# Patient Record
Sex: Male | Born: 1995 | Race: White | Hispanic: No | Marital: Single | Smoking: Never smoker
Health system: Southern US, Community
[De-identification: ages and names within clinical notes are randomized; demographics above are authoritative.]

---

## 2015-11-20 ENCOUNTER — Encounter: Payer: Self-pay | Admitting: Cardiology

## 2015-11-21 NOTE — Progress Notes (Signed)
This encounter was created in error - please disregard.

## 2015-12-02 ENCOUNTER — Other Ambulatory Visit: Payer: Self-pay | Admitting: Neurology

## 2015-12-02 DIAGNOSIS — G4482 Headache associated with sexual activity: Secondary | ICD-10-CM

## 2015-12-14 ENCOUNTER — Ambulatory Visit
Admission: RE | Admit: 2015-12-14 | Discharge: 2015-12-14 | Disposition: A | Discharge status: Home / Self Care | Payer: Self-pay | Source: Ambulatory Visit | Attending: Neurology | Admitting: Neurology

## 2015-12-14 ENCOUNTER — Ambulatory Visit: Payer: Self-pay

## 2015-12-14 DIAGNOSIS — G4482 Headache associated with sexual activity: Secondary | ICD-10-CM | POA: Insufficient documentation

## 2015-12-14 DIAGNOSIS — G43009 Migraine without aura, not intractable, without status migrainosus: Secondary | ICD-10-CM | POA: Insufficient documentation

## 2015-12-14 MED ORDER — GADOBENATE DIMEGLUMINE 529 MG/ML IV SOLN
20.0000 mL | Freq: Once | INTRAVENOUS | Status: AC | PRN
  Administered 2015-12-14: 20 mL via INTRAVENOUS

## 2016-02-18 ENCOUNTER — Ambulatory Visit (INDEPENDENT_AMBULATORY_CARE_PROVIDER_SITE_OTHER): Payer: Self-pay | Admitting: Family Medicine

## 2016-02-18 ENCOUNTER — Encounter: Payer: Self-pay | Admitting: Family Medicine

## 2016-02-18 VITALS — BP 136/96 | HR 65 | Temp 97.4°F | Resp 16

## 2016-02-18 DIAGNOSIS — J028 Acute pharyngitis due to other specified organisms: Secondary | ICD-10-CM

## 2016-02-18 DIAGNOSIS — R5383 Other fatigue: Secondary | ICD-10-CM

## 2016-02-18 LAB — POCT RAPID STREP A (OFFICE): Rapid Strep A Screen: NEGATIVE

## 2016-02-18 NOTE — Progress Notes (Signed)
Patient presents with symptoms of sore throat which started last week. Now patient has cough, nasal congestion, fatigue, chills at times. Denies CP, SOB, N/V/D, severe headache. Has not taken any medications today.  ROS: Negative except mentioned above.  Vitals as per Epic.  GENERAL: NAD HEENT: mild pharyngeal erythema, no exudate, no erythema of TMs, mild cervical LAD RESP: CTA B CARD: RRR ABD: +BS, NT, no organomegly appreciated  NEURO: CN II-XII grossly intact    A/P: URI, Pharyngitis - rapid strep test was negative, CBC w/diff and monospot test drawn. No athletic activity if febrile and until monospot result back. Delysm prn cough, Ibuprofen prn, rest, hydration, seek medical attention if symptoms persist or worsen.

## 2016-02-19 LAB — CBC WITH DIFFERENTIAL/PLATELET
BASOS ABS: 0 10*3/uL (ref 0.0–0.2)
Basos: 0 %
EOS (ABSOLUTE): 0.1 10*3/uL (ref 0.0–0.4)
Eos: 2 %
HEMOGLOBIN: 14.9 g/dL (ref 12.6–17.7)
Hematocrit: 43.3 % (ref 37.5–51.0)
IMMATURE GRANS (ABS): 0 10*3/uL (ref 0.0–0.1)
IMMATURE GRANULOCYTES: 0 %
LYMPHS: 25 %
Lymphocytes Absolute: 1.8 10*3/uL (ref 0.7–3.1)
MCH: 28.7 pg (ref 26.6–33.0)
MCHC: 34.4 g/dL (ref 31.5–35.7)
MCV: 83 fL (ref 79–97)
MONOCYTES: 10 %
Monocytes Absolute: 0.7 10*3/uL (ref 0.1–0.9)
NEUTROS ABS: 4.7 10*3/uL (ref 1.4–7.0)
NEUTROS PCT: 63 %
PLATELETS: 261 10*3/uL (ref 150–379)
RBC: 5.19 x10E6/uL (ref 4.14–5.80)
RDW: 13.1 % (ref 12.3–15.4)
WBC: 7.3 10*3/uL (ref 3.4–10.8)

## 2016-02-19 LAB — MONONUCLEOSIS SCREEN: MONO SCREEN: NEGATIVE

## 2016-11-19 ENCOUNTER — Other Ambulatory Visit: Payer: Self-pay | Admitting: Family Medicine

## 2016-11-19 DIAGNOSIS — M25562 Pain in left knee: Principal | ICD-10-CM

## 2016-11-19 DIAGNOSIS — G8929 Other chronic pain: Secondary | ICD-10-CM

## 2017-10-14 IMAGING — MR MR HEAD WO/W CM
13 series · 48 of 48 positions shown · IV contrast (multihance)
Comparison: None.

CLINICAL DATA: Exertional headache.

EXAM:
MRI HEAD WITHOUT AND WITH CONTRAST
MRA HEAD WITHOUT CONTRAST
TECHNIQUE: Multiplanar, multiecho pulse sequences of the brain and surrounding
structures were obtained without and with intravenous contrast. MR
venography images of the head were obtained using MRV technique
without contrast.
CONTRAST:  20mL MULTIHANCE GADOBENATE DIMEGLUMINE 529 MG/ML IV SOLN

[Series 2: T1 · sagittal · 5.0mm · 0.45mm/px · 3 of 27 slices shown (1 of 2)]
[im 1/27]
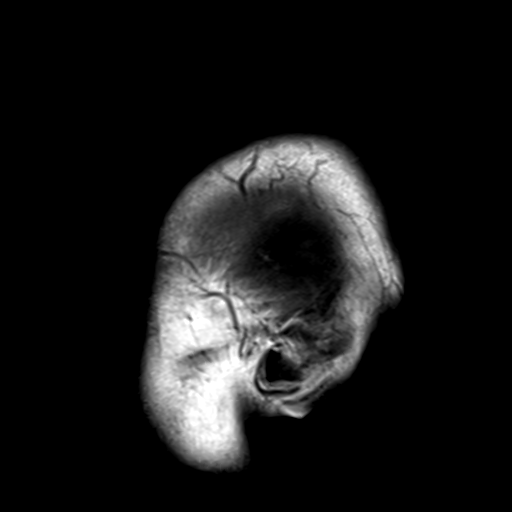
[im 14/27]
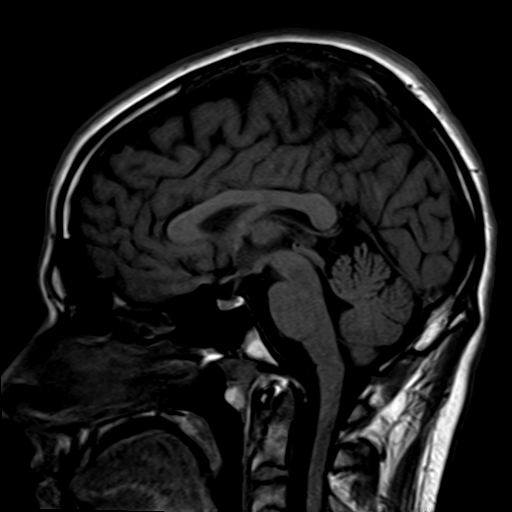
[im 27/27]
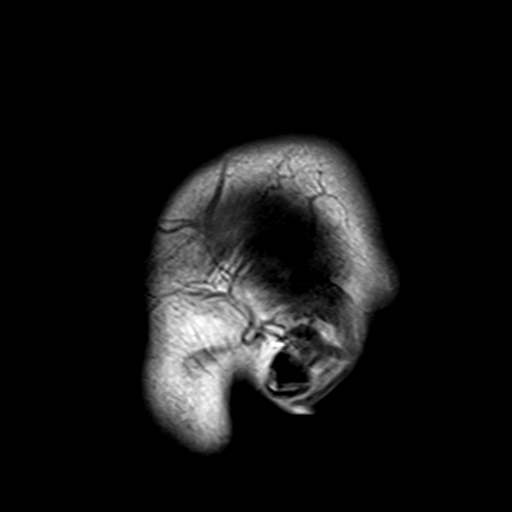

[Series 4: DWI · axial · 3.0mm · 1.80mm/px · z∈[-63,+98]mm · 5 of 54 slices shown (1 of 4)]
[im 1/54]
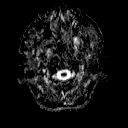
[im 14/54]
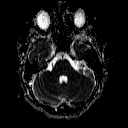
[im 27/54]
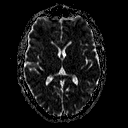
[im 40/54]
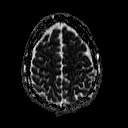
[im 54/54]
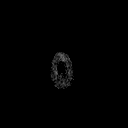

[Series 6: DWI · coronal · 3.0mm · 1.80mm/px · 4 of 47 slices shown (2 of 4)]
[im 1/47]
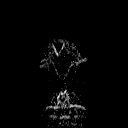
[im 16/47]
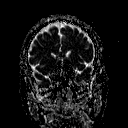
[im 31/47]
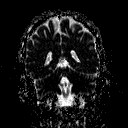
[im 47/47]
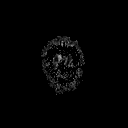

[Series 7: T2 · axial · 5.0mm · 0.60mm/px · z∈[-62,+100]mm · 2 of 26 slices shown (1 of 2)]
[im 1/26]
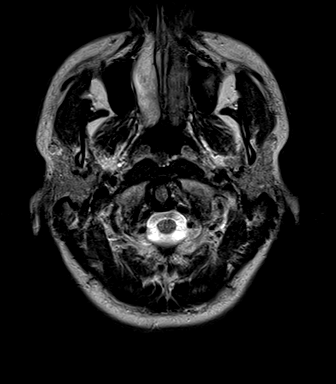
[im 26/26]
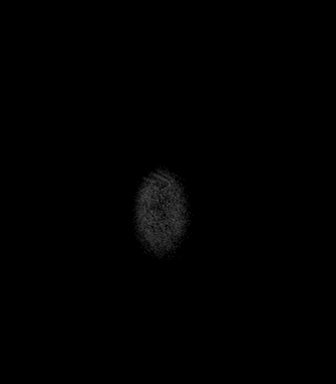

[Series 8: FLAIR · axial · 5.0mm · 0.45mm/px · z∈[-62,+100]mm · 2 of 26 slices shown]
[im 1/26]
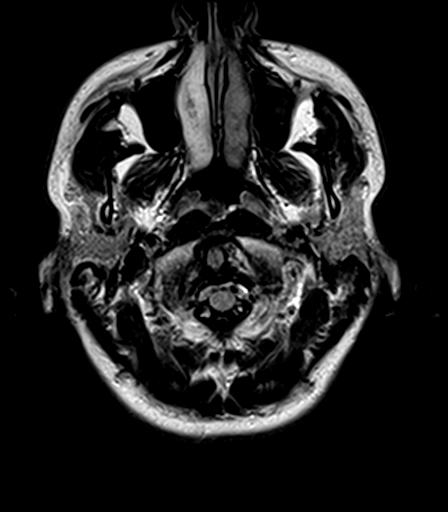
[im 26/26]
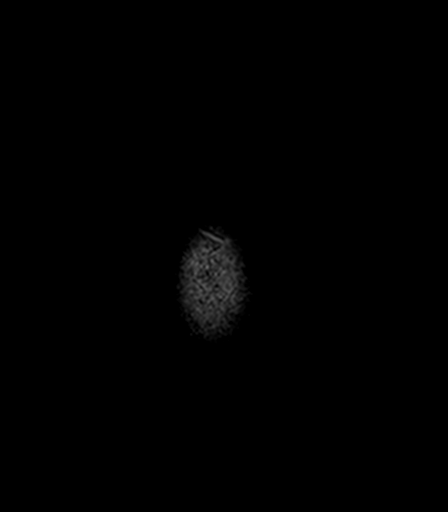

[Series 9: (id) · coronal · 3.0mm · 0.98mm/px · 8 of 100 slices shown]
[im 1/100]
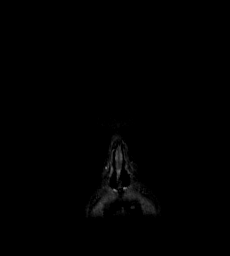
[im 15/100]
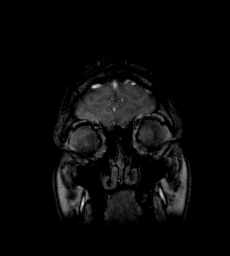
[im 29/100]
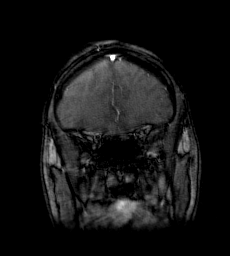
[im 43/100]
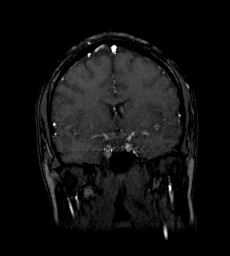
[im 57/100]
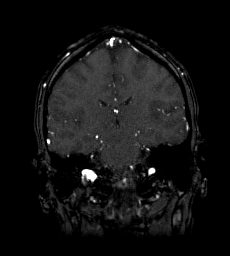
[im 71/100]
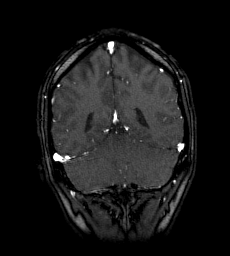
[im 85/100]
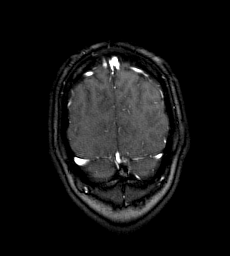
[im 100/100]
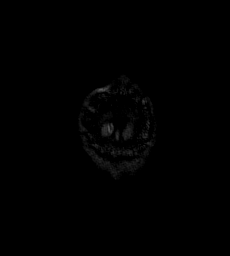

[Series 14: T2 · axial · 5.0mm · 0.45mm/px · z∈[-62,+100]mm · 2 of 26 slices shown (2 of 2)]
[im 1/26]
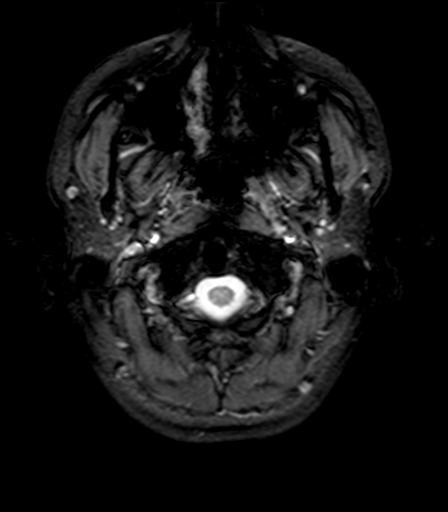
[im 26/26]
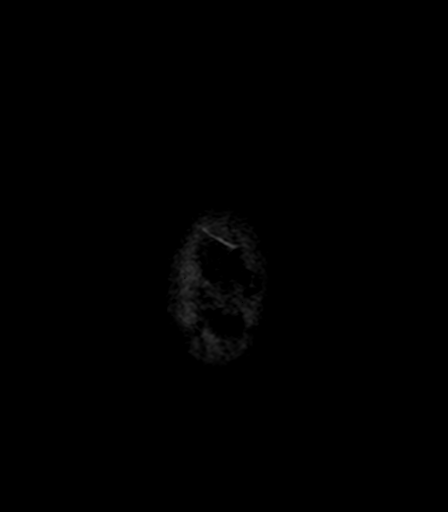

[Series 15: T1 · axial · 3.0mm · 1.00mm/px · z∈[-70,+106]mm · 5 of 60 slices shown (2 of 2)]
[im 1/60]
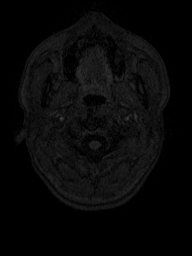
[im 15/60]
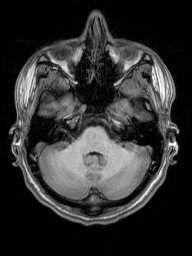
[im 30/60]
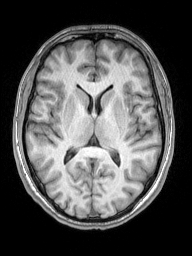
[im 45/60]
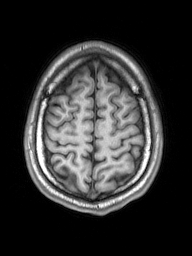
[im 60/60]
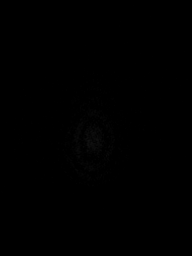

[Series 16: T2 post-contrast · coronal · 5.0mm · 0.49mm/px · 2 of 29 slices shown]
[im 1/29]
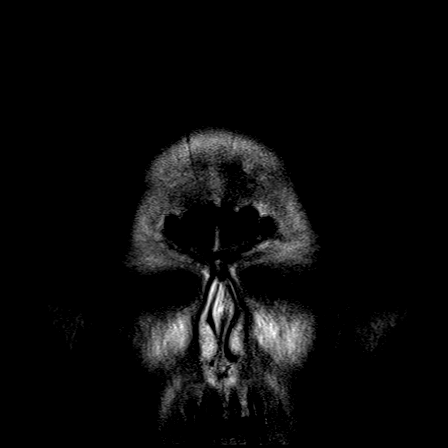
[im 29/29]
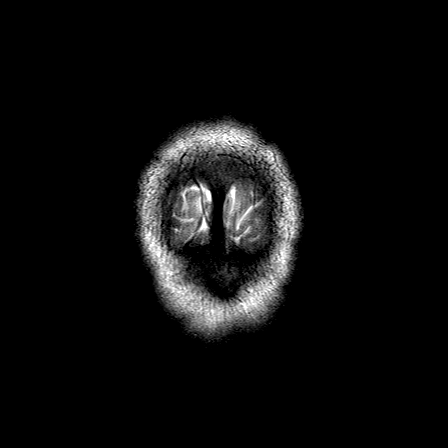

[Series 17: T1 post-contrast · axial · 3.0mm · 1.00mm/px · z∈[-70,+106]mm · 5 of 60 slices shown (1 of 2)]
[im 1/60]
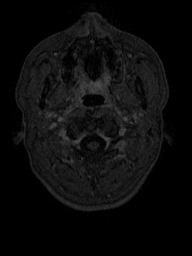
[im 15/60]
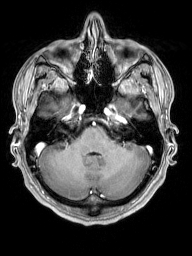
[im 30/60]
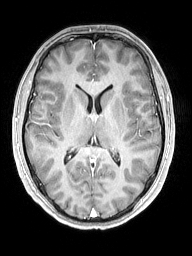
[im 45/60]
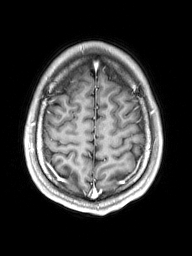
[im 60/60]
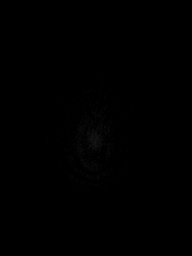

[Series 18: T1 post-contrast · coronal · 5.0mm · 0.43mm/px · 2 of 29 slices shown (2 of 2)]
[im 1/29]
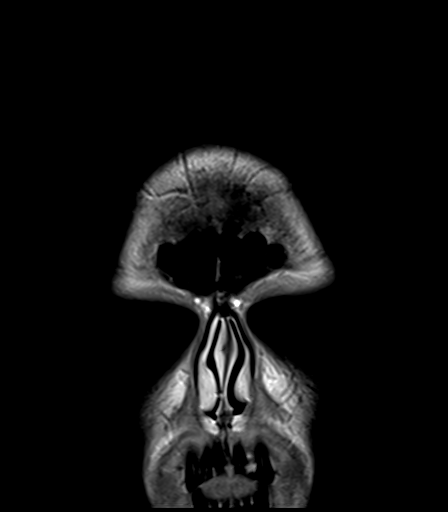
[im 29/29]
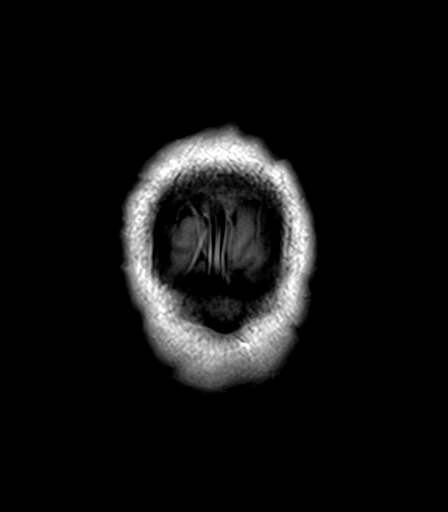

[Series 100: DWI · axial · 3.0mm · 1.80mm/px · z∈[-63,+98]mm · 4 of 55 slices shown (3 of 4)]
[im 1/55]
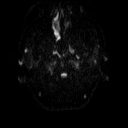
[im 19/55]
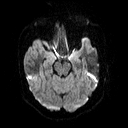
[im 37/55]
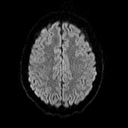
[im 55/55]
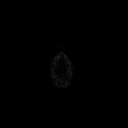

[Series 103: DWI · coronal · 3.0mm · 1.80mm/px · 4 of 49 slices shown (4 of 4)]
[im 1/49]
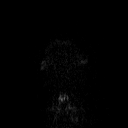
[im 17/49]
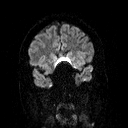
[im 33/49]
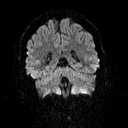
[im 49/49]
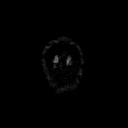

[48 of 48 positions shown; findings below may reference images not displayed]

FINDINGS: MRI HEAD FINDINGS

The brain has a normal appearance on all pulse sequences without
evidence of malformation, atrophy, old or acute infarction, mass
lesion, hemorrhage, hydrocephalus or extra-axial collection. No
pituitary mass. No fluid in the sinuses, middle ears or mastoids. No
skull or skullbase lesion. There is flow in the major vessels at the
base of the brain. Major venous sinuses show flow. No abnormal
contrast enhancement.

MRV HEAD FINDINGS

Superior sagittal sinus is patent. Both transverse sinuses are
patent with the left being congenitally small. Sigmoid sinuses are
patent. Jugular veins are patent. Deep venous system is patent. No
detectable superficial thrombosis.
IMPRESSION: Normal MRI of the brain. Normal MR venography. No cause of
exertional headaches identified.
